# Patient Record
Sex: Male | Born: 1965 | Race: White | Hispanic: No | Marital: Married
Health system: Southern US, Community
[De-identification: ages and names within clinical notes are randomized; demographics above are authoritative.]

---

## 2018-02-16 ENCOUNTER — Ambulatory Visit (INDEPENDENT_AMBULATORY_CARE_PROVIDER_SITE_OTHER): Payer: Commercial Managed Care - PPO | Admitting: Sports Medicine

## 2018-02-16 ENCOUNTER — Encounter: Payer: Self-pay | Admitting: Sports Medicine

## 2018-02-16 DIAGNOSIS — S3991XA Unspecified injury of abdomen, initial encounter: Secondary | ICD-10-CM | POA: Diagnosis not present

## 2018-02-16 NOTE — Progress Notes (Signed)
  Jamie 85Obriant - 52 y.o. male MRN [id number]  Date of birth: [birth year]   Chief complaint: Groin pain on left  SUBJECTIVE:    History of present illness: Jamie Stout is a 52yo male who presents today with the chief complaint of L groin pain. Symptoms began 3 weeks ago while he was running outside. He felt a sudden pulling sensation in his left groin and suprapubic region. Pain is described as dull and rated 4/10 in nature. Denies ecchymoses at that time however that area was tender.  Since then, he reports activities like bearing down with bowel movements, sitting up in bed, and lifting heavy objects exacerbates his symptoms.  Relative rest from running makes his symptoms improved.  He has taken anti-inflammatories which does help with his symptoms.  Denies any major changes in his bowel habits.  No constipation.  No severe abdominal pain.  Denies any injury to his groin in the past.  No history of osteoarthritis of the left hip.  Denies any lateral hip pain or groin pain.   Review of systems:  As stated above   Past medical history: BPH, asthma, HLP Past surgical history: none Past family history: None Social history: non smoker, very active with running/swimming/biking  Medications: albuterol, rosuvastatin, tamsulosin Allergies: PCN, sulfa, codeine  OBJECTIVE:  Physical exam: Vital signs are reviewed. BP (!) 142/96   Ht 5\' 9"  (1.753 m)   Wt 240 lb (108.9 kg)   BMI 35.44 kg/m   Gen.: Alert, oriented, appears stated age, in no apparent distress Respiratory: Normal respirations, able to speak in full sentences Cardiac: Regular rate, distal pulses 2+ Integumentary: No rashes or ecchymosis  Gait: normal without associated limp Musculoskeletal: Inspection of the left hip demonstrates no acute abnormality.  No visible skin changes.  He does have tenderness over his suprapubic region on the left as well as in his adductor muscle belly.  He has limited range of motion and resisted abduction  of his leg secondary to significant pain.  He also does have discomfort with 45 to 50 degrees of abduction.  Flexion and external rotation also exacerbates his symptoms.  Strength testing is 5 out of 5 in hip flexion and extension.  4.5 out of 5 in hip abduction.  5 out of 5 in hip abduction.  He has discomfort with sitting up from a supine position.  Negative logroll test.  Negative Stinchfield test.  Neurovascularly intact.  Limited Diagnostic Ultrasound, L hip Sartorius visualized and intact without hypoechoic fluid. Imaging of lower rectus abdominus demonstrates 1.5cm by 1cm defect in the muscle belly which increases with valsalva maneuvers. Adductor muscle belly visualized without evidence of muscle strain or neovascularization.    ASSESSMENT & PLAN: Groin injury, initial encounter Rectus abdominus and adductor strengthening for the next 2-4 weeks. Resistance band given. If not improving, consider MRI to investigate size of hernia. Limited diagnostic US performed today showing a 1.5cm by 1cm defect in rectus abdominus muscle increasing with valsalva. NSAIDs as needed for pain or discomfort. Tentative f/u in 4 weeks to see progress. Activity as tolerate.   Gustavus MessingAJ Dolorez Jeffrey, DO Sports Medicine Fellow [facility]

## 2018-02-16 NOTE — Assessment & Plan Note (Addendum)
Rectus abdominus and adductor strengthening for the next 2-4 weeks. If not improving, consider MRI to investigate size of hernia. Limited diagnostic US performed today showing a 1.5cm by 1cm defect in rectus abdominus muscle increasing with valsalva. NSAIDs as needed for pain or discomfort. Tentative f/u in 4 weeks to see progress. Activity as tolerate.

## 2018-02-16 NOTE — Patient Instructions (Signed)
Rectus abdominus and adductor strengthening for the next 2-4 weeks with resistance band. Perform 1-2x daily. Gradual return back to run once pain free. Start at 1/2 the distance you are used to. Careful on squatting or heavy lifting. If not improving, consider MRI to investigate size of hernia. Limited diagnostic US performed today showing a 1.5cm by 1cm defect in rectus abdominus muscle increasing with valsalva. NSAIDs as needed for pain or discomfort. Tentative f/u in 4 weeks to see progress. Activity as tolerate.

## 2018-03-16 ENCOUNTER — Other Ambulatory Visit: Payer: Self-pay | Admitting: Sports Medicine

## 2018-03-16 ENCOUNTER — Ambulatory Visit (INDEPENDENT_AMBULATORY_CARE_PROVIDER_SITE_OTHER): Payer: Commercial Managed Care - PPO | Admitting: Sports Medicine

## 2018-03-16 ENCOUNTER — Encounter: Payer: Self-pay | Admitting: Sports Medicine

## 2018-03-16 VITALS — BP 142/100 | Ht 69.0 in | Wt 245.0 lb

## 2018-03-16 DIAGNOSIS — S3981XD Other specified injuries of abdomen, subsequent encounter: Secondary | ICD-10-CM | POA: Diagnosis not present

## 2018-03-16 DIAGNOSIS — S3991XA Unspecified injury of abdomen, initial encounter: Secondary | ICD-10-CM

## 2018-03-16 DIAGNOSIS — S3981XA Other specified injuries of abdomen, initial encounter: Secondary | ICD-10-CM | POA: Insufficient documentation

## 2018-03-16 NOTE — Assessment & Plan Note (Signed)
-   ongoing pain in L groin, probable sports hernia - MRI of pelvis to further characterize - will call with results - NSAIDs as needed for pain and discomfort - Activity as tolerated - goal is to get back to running as it has been 7-8 weeks since he has been able to do so - not responding to PT

## 2018-03-16 NOTE — Patient Instructions (Signed)
We will order an MRI of your pelvis to evaluate for a sports hernia, knowing that there may be a surgical vs non surgical fix to your problem. It has been 7-8 weeks since you have been able to run, so the goal would be to restore this dysfunction. Please call if you have questions. I will call you with the results and then set up follow [facility] depending upon the results. Activity as tolerated.

## 2018-03-16 NOTE — Progress Notes (Signed)
Jamie Stout 46 - 52 y.o. male MRN [id number]  Date of birth: July 07, 1965   Chief complaint: Left groin pain  SUBJECTIVE:    History of present illness: Jamie Stout is a 52 year old male who presents today for follow-up of left groin pain.  He was referred over by Dr. Thurston Stout for an ultrasound of his left groin to rule out sports hernia.  He did have a limited diagnostic ultrasound which demonstrated a 1 cm x 1.5 cm defect in his left rectus abdominis muscle consistent with possible sports hernia.  He tried nonoperative management with hip abductor strengthening and hip flexor strengthening.  He reports only trace improvement with this.  He still has a dull pain most days of the week especially with movements like stretching of his groin or reaching with his opposite arm out to lift something.  He is trying to get back into running however due to this injury he has not been able to run for the last 7 to 8 weeks.  He is tried anti-inflammatories without relief of his symptoms.  He is here today to have more information in regards to what the diagnosis is of his current problem.  Denies any new injury or new symptoms.  No numbness or tingling.  No knee pain or groin pain associated with his problems.  No low back pain. No rashes or ecchymosis.   Review of systems:  As stated above   Interval past medical history, surgical history, family history, and social history obtained and are unchanged.   Medications reviewed. Proair, Crestor, Floxmas. Allergies reviewed.  Pertinent positives include penicillin, sulfa, and codeine  OBJECTIVE:  Physical exam: Vital signs are reviewed. BP (!) 142/100   Ht 5\' 9"  (1.753 m)   Wt 245 lb (111.1 kg)   BMI 36.18 kg/m   Gen.: Alert, oriented, appears stated age, in no apparent distress Neurologic: nonfocal, sensation is intact to light touch L4-S1 bilaterally Gait: normal without associated limp and without assistive devices  Musculoskeletal: No obvious  abnormalities upon inspection of his left groin.  He has significant tenderness to palpation in his suprapubic region.  He has limited abduction of his left lower extremity secondary to a feeling of stretching.  He has discomfort with resisted flexion of his left hip.  External rotation also reproduces his symptoms.  Strength testing is 5 out of 5 in hip flexion and extension.  4.5 out of 5 in hip adduction. 5/5 in hip abduction. Neurovascularly intact.    ASSESSMENT & PLAN: Sports hernia - ongoing pain in L groin, probable sports hernia - MRI of pelvis to further characterize - will call with results - NSAIDs as needed for pain and discomfort - Activity as tolerated - goal is to get back to running as it has been 7-8 weeks since he has been able to do so - not responding to PT   Orders Placed This Encounter  Procedures  . MR PELVIS WO CONTRAST    Epic order Wt 245 / ht 5'9 / slight claus / no hx of pelvic sx / no hx of brain, heart, ears sx / lasix sx / no metal removed from eyes / no metal in body / no implants / no needs Ins - [facility] bl/pt    Standing Status:   Future    Standing Expiration Date:   05/17/2019    Order Specific Question:   ** REASON FOR EXAM (FREE TEXT)    Answer:   rule out sports hernia  Order Specific Question:   What is the patient's sedation requirement?    Answer:   No Sedation    Order Specific Question:   Does the patient have a pacemaker or implanted devices?    Answer:   No    Order Specific Question:   Preferred imaging location?    Answer:   [facility] (table limit-550lbs)    Order Specific Question:   Radiology Contrast Protocol - do NOT remove file path    Answer:   [email or web address]     Gustavus Messing, DO Sports Medicine Fellow [facility]

## 2018-03-22 ENCOUNTER — Other Ambulatory Visit: Payer: Self-pay | Admitting: *Deleted

## 2018-03-22 ENCOUNTER — Ambulatory Visit
Admission: RE | Admit: 2018-03-22 | Discharge: 2018-03-22 | Disposition: A | Discharge status: Home / Self Care | Payer: Commercial Managed Care - PPO | Source: Ambulatory Visit | Attending: Sports Medicine | Admitting: Sports Medicine

## 2018-03-22 DIAGNOSIS — S3981XD Other specified injuries of abdomen, subsequent encounter: Secondary | ICD-10-CM

## 2018-03-22 NOTE — Progress Notes (Signed)
[facility] [address] [place]  Dr Gaynelle Adu Thursday 03/24/18 at 945a Arrival time is 915a  [phone]

## 2019-02-19 IMAGING — MR MR PELVIS W/O CM
4 of 6 series · 26 of 48 positions shown · non-contrast
Comparison: None.

CLINICAL DATA: Left groin injury while running 7 weeks ago.
Continued pain.

EXAM:
MRI PELVIS WITHOUT CONTRAST
TECHNIQUE: Multiplanar multisequence MR imaging of the pelvis was performed. No
intravenous contrast was administered.

[Series 3: T1 · coronal · 4.0mm · 1.16mm/px · 3 of 44 slices shown]
[im 8/44]
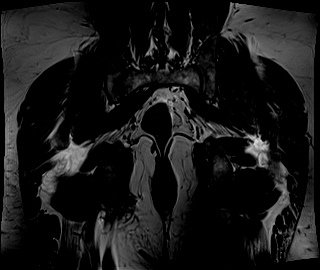
[im 22/44]
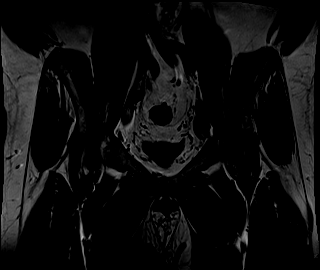
[im 36/44]
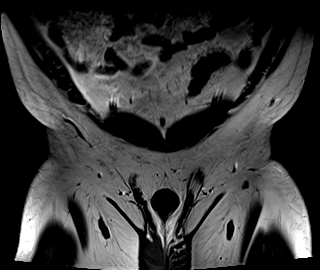

[Series 5: T2 fat-sat · axial · 4.0mm · 0.83mm/px · z∈[-141,+179]mm · 9 of 65 slices shown (1 of 3)]
[im 1/65]
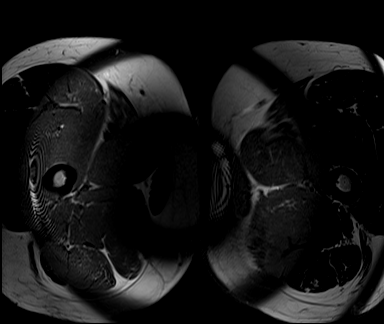
[im 12/65]
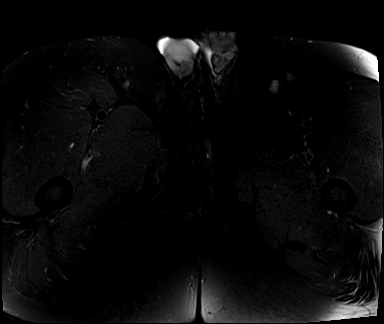
[im 18/65]
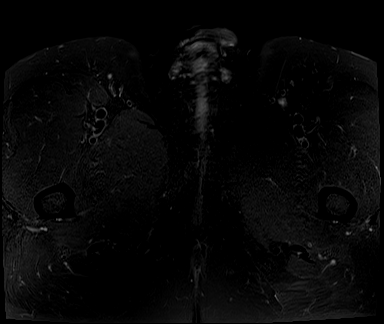
[im 30/65]
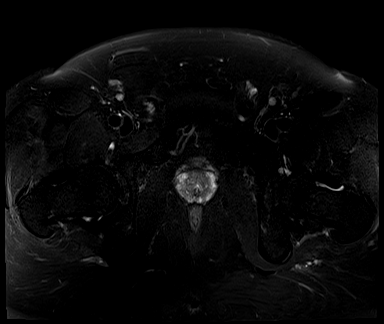
[im 35/65]
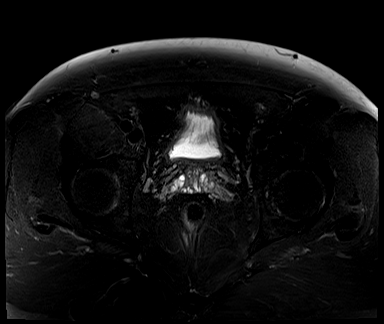
[im 47/65]
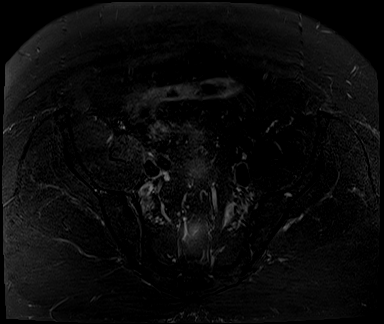
[im 53/65]
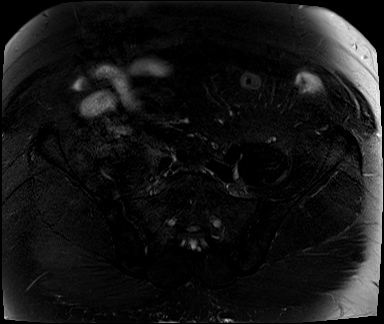
[im 59/65]
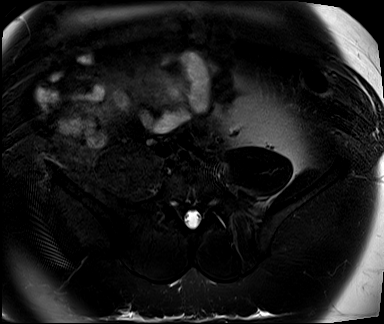
[im 65/65]
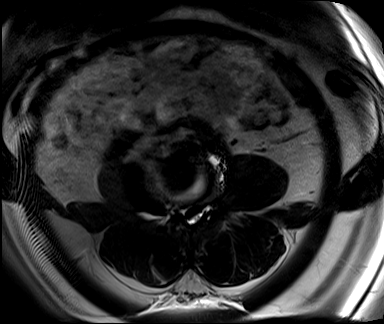

[Series 6: T2 fat-sat · sagittal · 4.0mm · 0.57mm/px · 8 of 44 slices shown (2 of 3)]
[im 1/44]
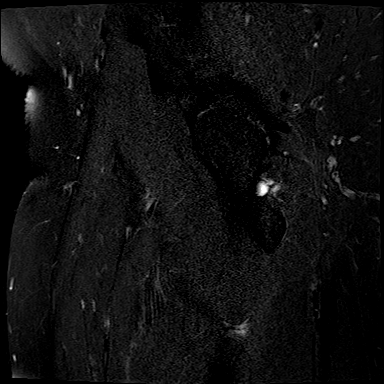
[im 7/44]
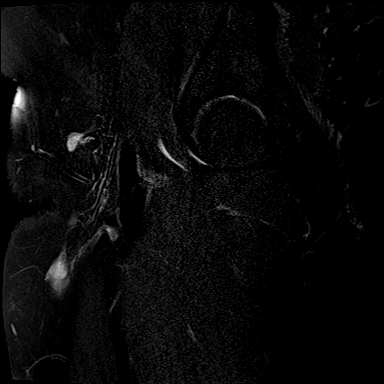
[im 13/44]
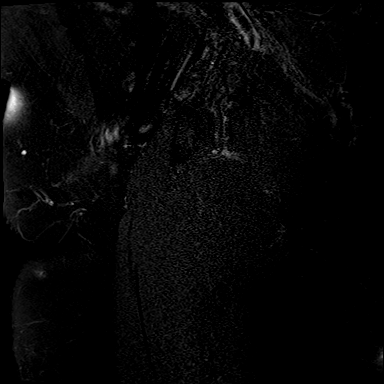
[im 19/44]
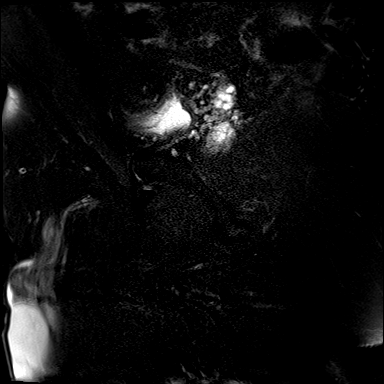
[im 25/44]
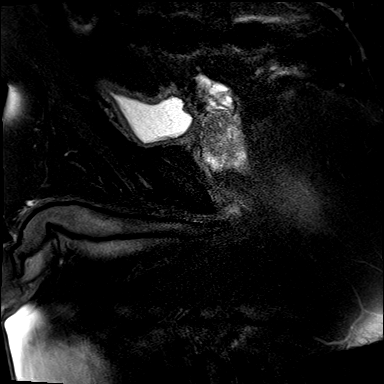
[im 31/44]
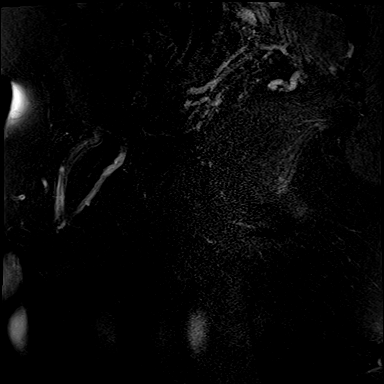
[im 37/44]
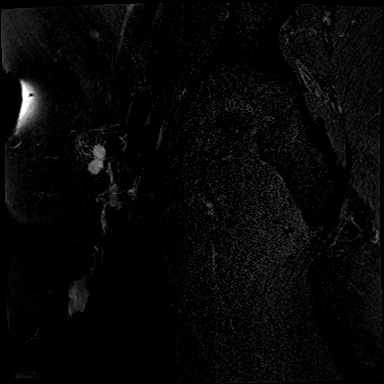
[im 44/44]
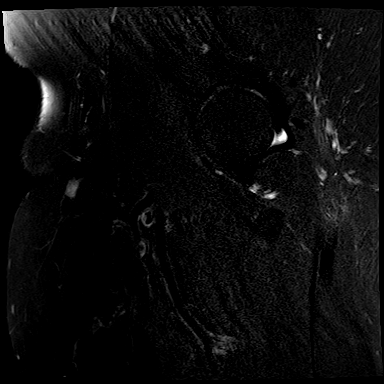

[Series 8: T2 fat-sat · coronal · 4.0mm · 0.39mm/px · 6 of 37 slices shown (3 of 3)]
[im 1/37]
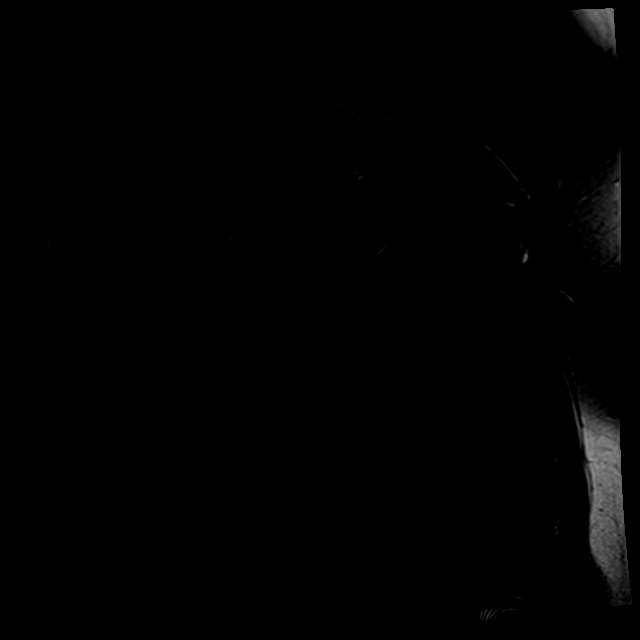
[im 7/37]
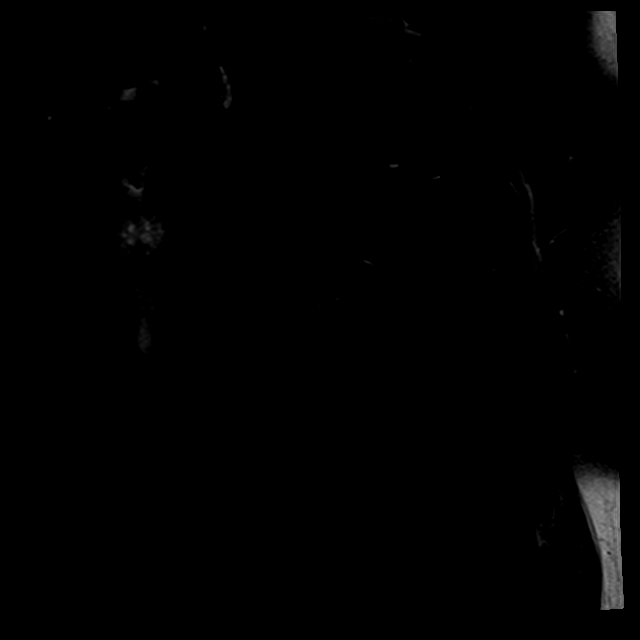
[im 13/37]
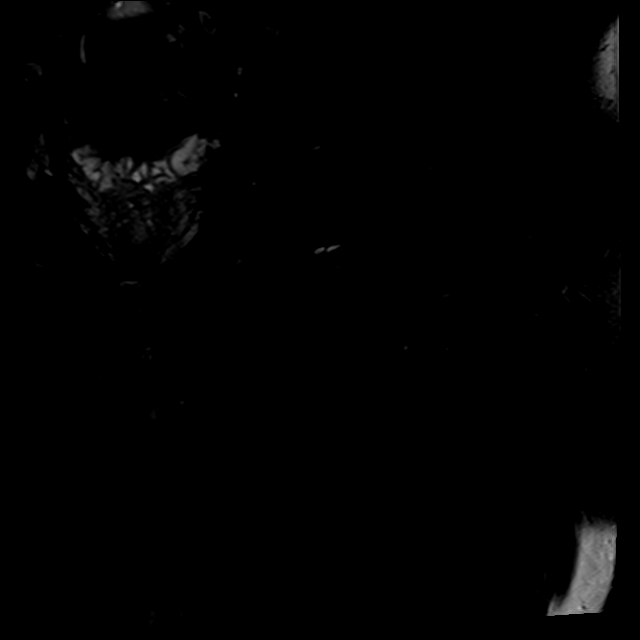
[im 19/37]
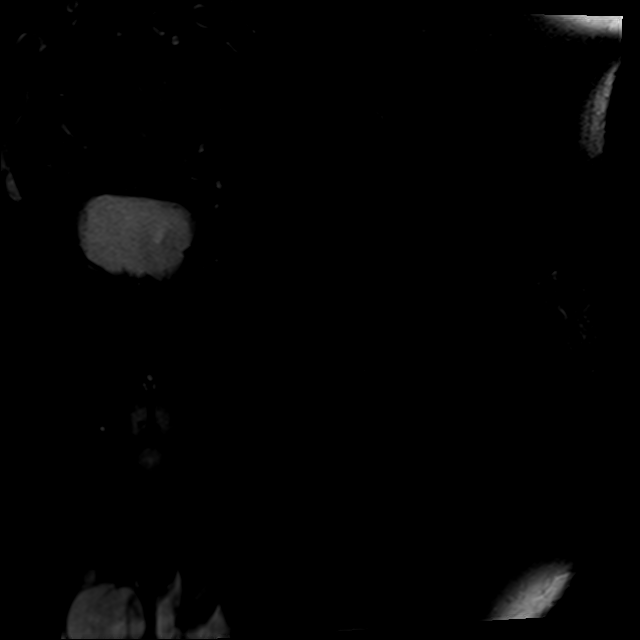
[im 25/37]
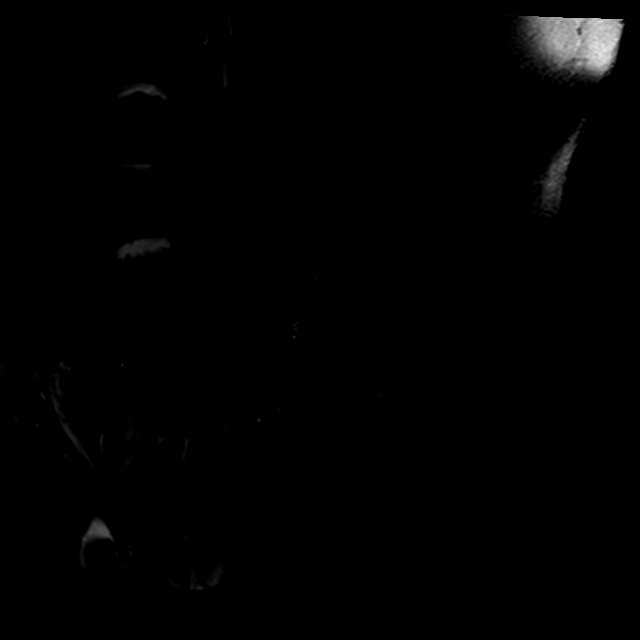
[im 31/37]
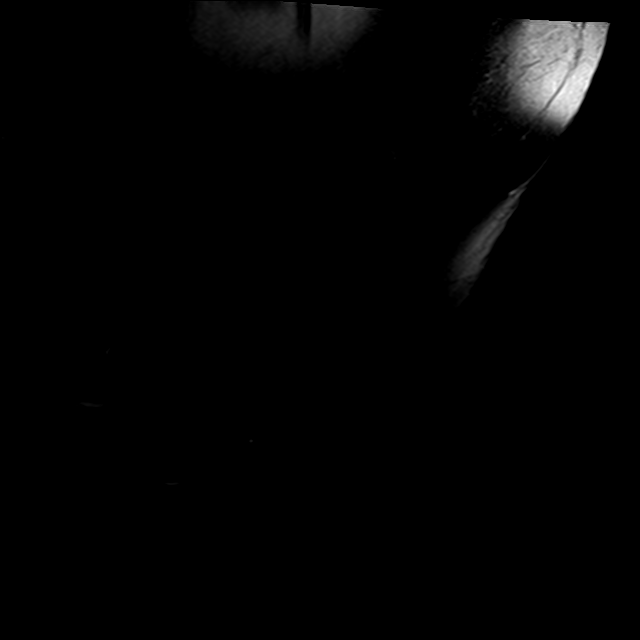

[26 of 48 positions shown; findings below may reference images not displayed]

FINDINGS: Musculoskeletal: There is abnormal accentuated T2 signal between the
left adductor aponeurosis (primarily for the adductor longus) and
the pubis on images 25 through 27 of series 6, compatible with a
small tear and athletic pubalgia.

Additionally, on the right side there is abnormal fluid signal
intensity compatible with tear between the pubis and the adductor
aponeurosis and to a lesser extent the rectus abdominus attachment,
as on image [DATE].

No separation of the pubic symphysis or overt fluid signal in the
pubic symphysis region.

No hip effusion or appreciable regional bursitis. No impingement
along the sciatic notch or sacral plexus.

Mild bilateral proximal hamstring tendinopathy.

Genitourinary: Small bilateral scrotal hydroceles.

Other: No supplemental non-categorized findings.
IMPRESSION: 1. Bilateral findings of athletic pubalgia with small but notable
tears of the adductor aponeurosis bilaterally, possibly with some
extension on the right side to involve the attachment of the rectus
abdominus to the pubis.
2. Small bilateral scrotal hydroceles.
3. Mild proximal hamstring tendinopathy bilaterally.
